# Patient Record
Sex: Male | Born: 1950 | Race: White | Hispanic: No | Marital: Married | Smoking: Never smoker
Health system: Southern US, Community
[De-identification: ages and names within clinical notes are randomized; demographics above are authoritative.]

## PROBLEM LIST (undated history)

## (undated) DIAGNOSIS — I10 Essential (primary) hypertension: Secondary | ICD-10-CM

## (undated) DIAGNOSIS — E119 Type 2 diabetes mellitus without complications: Secondary | ICD-10-CM

## (undated) DIAGNOSIS — E7800 Pure hypercholesterolemia, unspecified: Secondary | ICD-10-CM

## (undated) DIAGNOSIS — M109 Gout, unspecified: Secondary | ICD-10-CM

## (undated) HISTORY — PX: TOTAL HIP ARTHROPLASTY: SHX124

## (undated) HISTORY — PX: CARDIAC SURGERY: SHX584

---

## 2011-01-12 ENCOUNTER — Encounter (HOSPITAL_COMMUNITY)
Admission: RE | Admit: 2011-01-12 | Discharge: 2011-01-12 | Disposition: A | Discharge status: Home / Self Care | Payer: Worker's Compensation | Source: Ambulatory Visit | Attending: Orthopedic Surgery | Admitting: Orthopedic Surgery

## 2011-01-12 ENCOUNTER — Other Ambulatory Visit (HOSPITAL_COMMUNITY): Payer: Self-pay | Admitting: Orthopedic Surgery

## 2011-01-12 DIAGNOSIS — Z01812 Encounter for preprocedural laboratory examination: Secondary | ICD-10-CM | POA: Insufficient documentation

## 2011-01-12 DIAGNOSIS — Z01818 Encounter for other preprocedural examination: Secondary | ICD-10-CM | POA: Insufficient documentation

## 2011-01-12 DIAGNOSIS — M899 Disorder of bone, unspecified: Secondary | ICD-10-CM

## 2011-01-12 LAB — CBC
Platelets: 283 10*3/uL (ref 150–400)
RDW: 15 % (ref 11.5–15.5)
WBC: 9.1 10*3/uL (ref 4.0–10.5)

## 2011-01-12 LAB — SURGICAL PCR SCREEN: MRSA, PCR: NEGATIVE

## 2011-01-12 LAB — COMPREHENSIVE METABOLIC PANEL
Albumin: 4 g/dL (ref 3.5–5.2)
BUN: 28 mg/dL — ABNORMAL HIGH (ref 6–23)
Calcium: 9.4 mg/dL (ref 8.4–10.5)
Creatinine, Ser: 1.4 mg/dL (ref 0.4–1.5)
Total Protein: 6.9 g/dL (ref 6.0–8.3)

## 2011-01-12 LAB — APTT: aPTT: 33 seconds (ref 24–37)

## 2011-01-12 LAB — PROTIME-INR: INR: 0.92 (ref 0.00–1.49)

## 2011-01-13 LAB — URINALYSIS, ROUTINE W REFLEX MICROSCOPIC
Bilirubin Urine: NEGATIVE
Glucose, UA: NEGATIVE mg/dL
Hgb urine dipstick: NEGATIVE
Ketones, ur: NEGATIVE mg/dL
pH: 5.5 (ref 5.0–8.0)

## 2011-01-13 LAB — URINE MICROSCOPIC-ADD ON

## 2011-01-14 ENCOUNTER — Ambulatory Visit (HOSPITAL_COMMUNITY)
Admission: RE | Admit: 2011-01-14 | Discharge: 2011-01-14 | Disposition: A | Discharge status: Home / Self Care | Payer: Worker's Compensation | Source: Ambulatory Visit | Attending: Orthopedic Surgery | Admitting: Orthopedic Surgery

## 2011-01-14 DIAGNOSIS — I10 Essential (primary) hypertension: Secondary | ICD-10-CM | POA: Insufficient documentation

## 2011-01-14 DIAGNOSIS — M2240 Chondromalacia patellae, unspecified knee: Secondary | ICD-10-CM | POA: Insufficient documentation

## 2011-01-14 DIAGNOSIS — M23329 Other meniscus derangements, posterior horn of medial meniscus, unspecified knee: Secondary | ICD-10-CM | POA: Insufficient documentation

## 2011-01-14 DIAGNOSIS — E119 Type 2 diabetes mellitus without complications: Secondary | ICD-10-CM | POA: Insufficient documentation

## 2011-01-14 DIAGNOSIS — E669 Obesity, unspecified: Secondary | ICD-10-CM | POA: Insufficient documentation

## 2011-01-14 DIAGNOSIS — G4733 Obstructive sleep apnea (adult) (pediatric): Secondary | ICD-10-CM | POA: Insufficient documentation

## 2011-01-14 LAB — GLUCOSE, CAPILLARY: Glucose-Capillary: 126 mg/dL — ABNORMAL HIGH (ref 70–99)

## 2011-02-03 NOTE — Op Note (Signed)
NAMESALIM, FORERO                ACCOUNT NO.:  [id number]  MEDICAL RECORD NO.:  [id number]           PATIENT TYPE:  O  LOCATION:  [facility]                         FACILITY:  [facility]  PHYSICIAN:  Vania Rea. Tighe Gitto, M.D.  DATE OF BIRTH:  Oct 11, 1950  DATE OF PROCEDURE:  01/14/2011 DATE OF DISCHARGE:  01/14/2011                              OPERATIVE REPORT   PREOPERATIVE DIAGNOSIS:  Right medial knee pain with palpable medial meniscal tear and possible osteochondral defect in the medial femoral condyle.  POSTOPERATIVE DIAGNOSES: 1. Right knee posterior horn medial meniscus tear. 2. Chondromalacia of the patella.  PROCEDURES: 1. Right knee diagnostic arthroscopy. 2. Partial medial meniscectomy. 3. Chondroplasty of the patella.  SURGEON:  Vania Rea. Ellen Goris, MD  ASSISTANT:  Lucita Lora. Shuford, PA-C  ANESTHESIA:  LMA general.  ESTIMATED BLOOD LOSS:  Minimal.  DRAINS:  None.  TOURNIQUET:  None.  HISTORY:  Mr. Mcclenton is a 60 year old gentleman who has had persistent right knee pain, swelling, mechanical symptoms refractory to prolonged attempts at conservative management.  He completed today an MRI scan which showed evidence for a tear of the medial meniscus and also evidence for altered signal within the medial femoral condyle suggestive of a possible focal area of osteonecrosis versus osteochondral defect. Due to Mr. Bernasconi' ongoing pain, as well as functional limitations and mechanical symptoms, he is brought to the operating room at this time for planned right knee arthroscopy as described below.  Preoperatively counseled Mr. Tanzi on treatment options as well as risks versus benefits thereof.  Possible surgical complications were reviewed including potential for bleeding, infection, neurovascular injury, DVT, PE as well as persistent pain.  He understands and accepts and agrees with our planned procedure.  PROCEDURE IN DETAIL:  After undergoing routine preop evaluation,  the patient received prophylactic antibiotics and was brought to the operating, placed supine on operating table, underwent smooth induction of an LMA general anesthesia.  Right leg was placed in leg holder and sterilely prepped and draped in standard fashion.  Time-out was called. Standard arthroscopy portals were established and diagnostic arthroscopy was performed.  The suprapatellar pouch and gutter showed no loose bodies.  There was significant anterior chamber synovitis and the synovectomy was performed and then the Arthrex wand was used to obtain hemostasis.  The patella showed broad grade 3 chondromalacia diffusely on the central and lateral recess which was debrided to a stable chondral base with a shaver.  Intercondylar notch of the ACL to be intact.  Negative intraoperative Lachman.  Medially, I carefully inspected and probed the articular surface of the medial femoral condyle and did not see any evidence to suggest an type of flap lesion or unstable OCD.  The surfaces were actually in quite good condition on the medial femoral condyle.  There were some minimal fibrillations of the medial tibial plateau.  There was a radial tear of the posterior horn of medial meniscus and this was trimmed to a stable margin with a basket and then a shaver was used to final contouring and removal of the meniscal fragments.  Probing of the remaining portion of the medial  meniscus showed to be stable and an estimated approximately 20% of the meniscus was excised.  Laterally, the articular surfaces were in good condition and the lateral meniscus was intact and stable to probing.  At this point, final inspection and irrigation of the joint was then completed.  Fluid and instrument removed.  Combination of Marcaine, morphine, and clonidine was instilled in the knee joint and additional Marcaine about the ports.  Ports were closed with Steri-Strips.  Bulky dry dressings were wrapped about the right  knee and leg was wrapped with Ace bandage and a thigh-high support stocking.  The patient was then awakened, extubated, and taken to recovery room in stable condition.     Vania Rea. Royalty Domagala, M.D.     KMS/MEDQ  D:  01/14/2011  T:  01/14/2011  Job:  [id number]  Electronically Signed by Francena Hanly M.D. on 02/03/2011 02:31:21 PM

## 2012-04-18 IMAGING — CR DG CHEST 2V
2 series · 2 of 2 positions shown · non-contrast
Comparison: None.

CLINICAL DATA: Further evaluation for right knee arthroscopically.
Hypertension, diabetes and sleep apnea

CHEST - 2 VIEW

[view not recorded (1 of 2)]
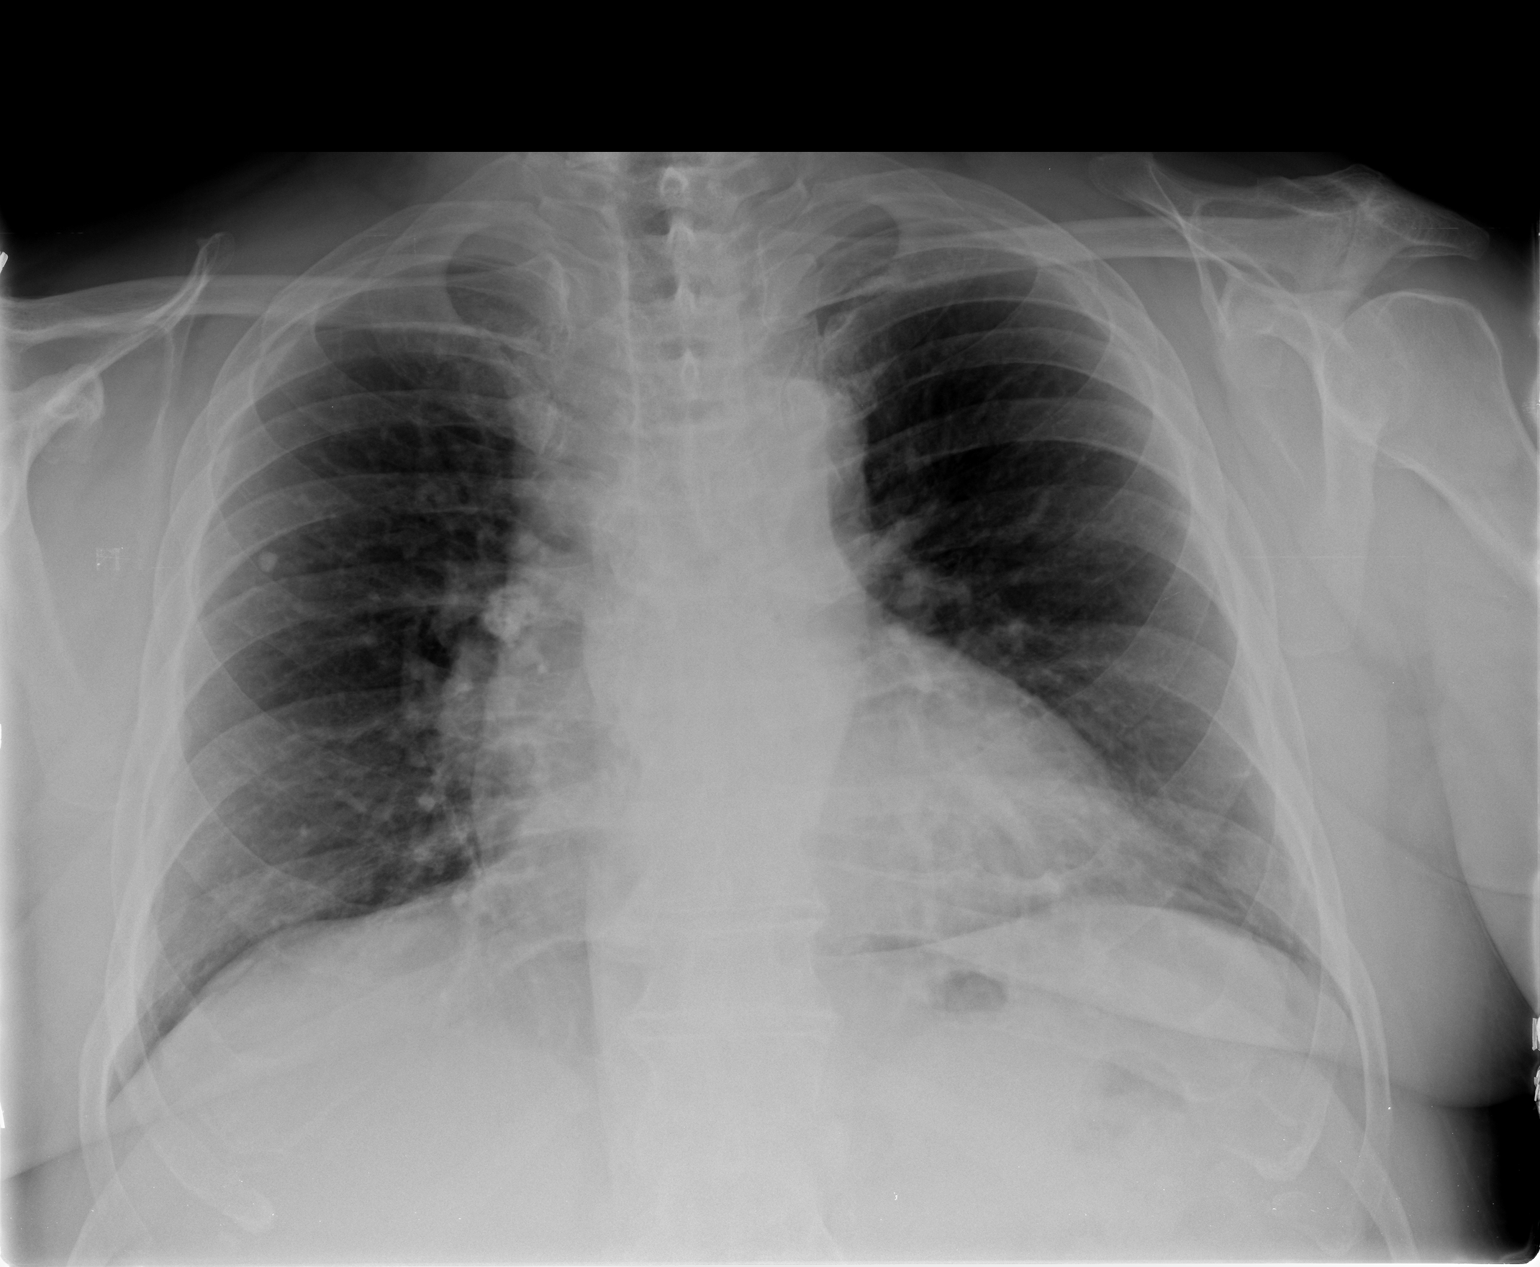

[view not recorded (2 of 2)]
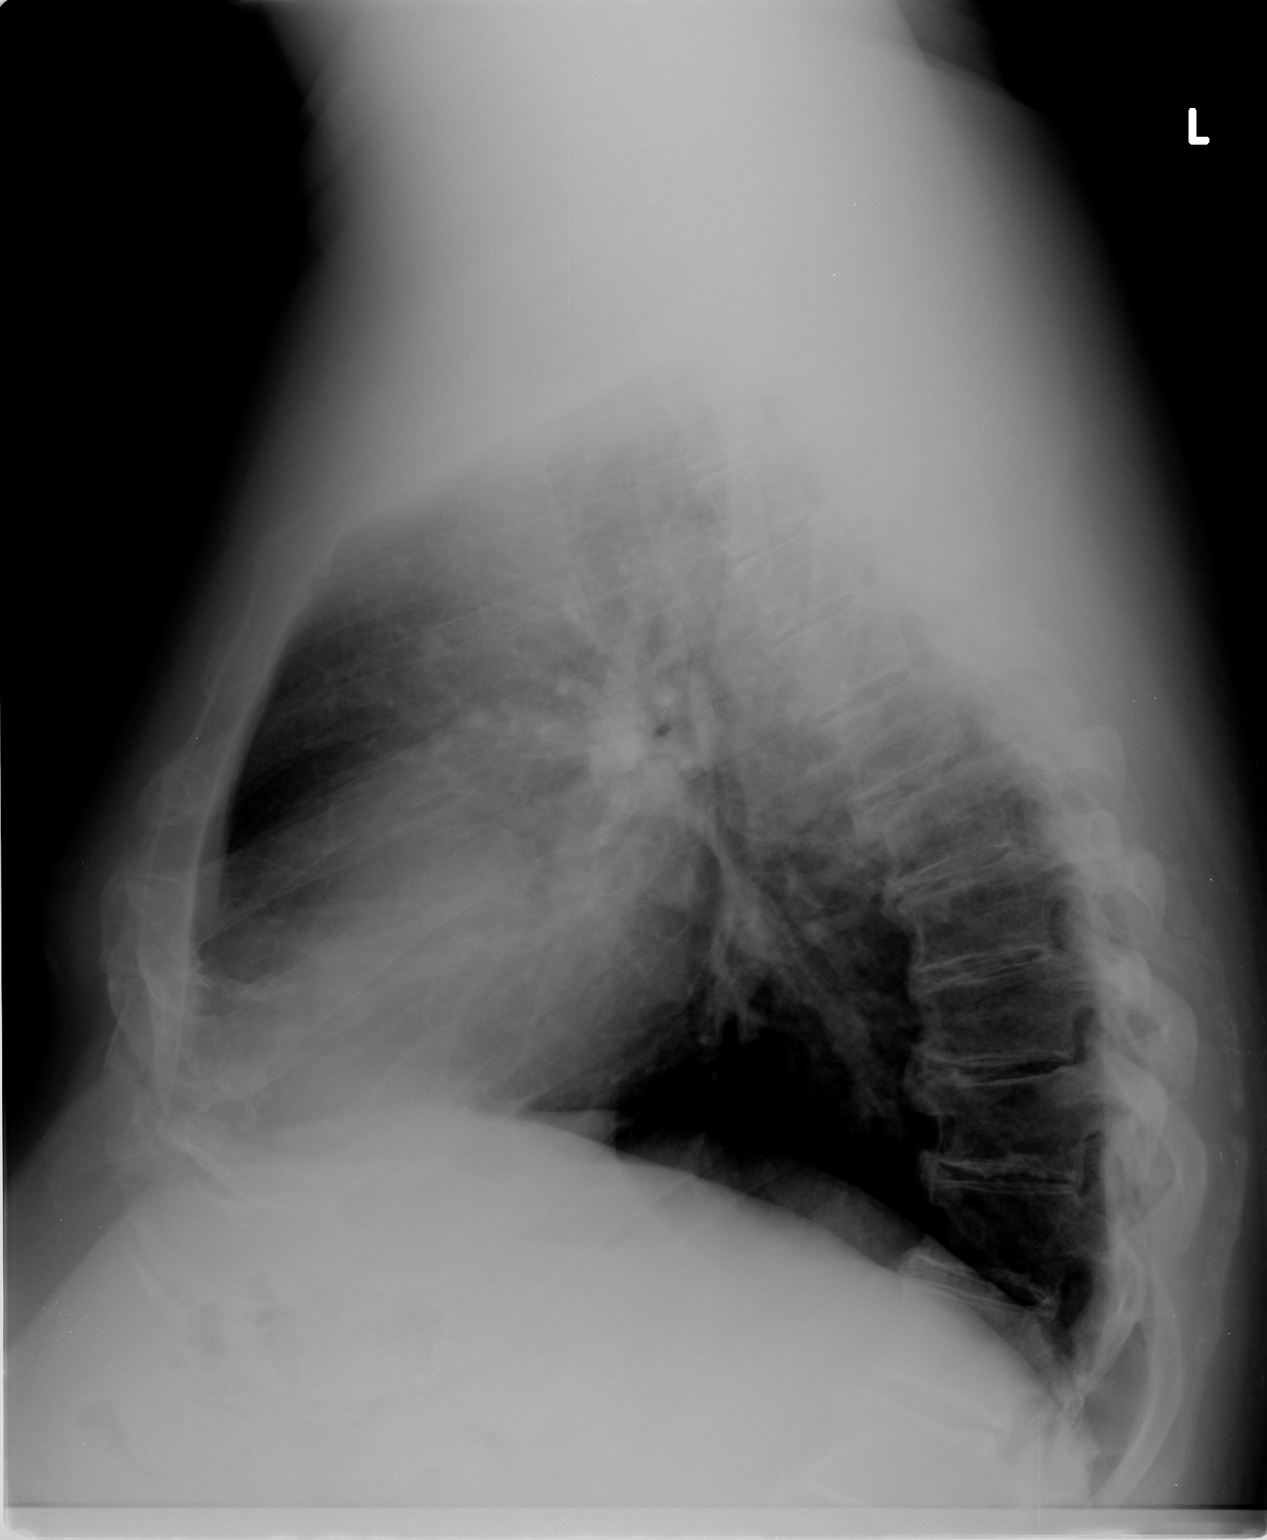

[2 of 2 positions shown; findings below may reference images not displayed]

FINDINGS: Low lung volumes are present with resultant crowding of
bronchovascular markings at both lung bases.

Heart size is at the upper limits of normal.  Calcified hilar lymph
nodes are seen compatible with prior granulomatous disease.  The
mediastinal contours otherwise unremarkable.

The lung fields are clear with the exception of a calcified
granuloma in the right upper lobe.  No focal infiltrates or signs
of congestive failure seen.  No pleural fluid or significant
peribronchial cuffing is identified.

Bony structures demonstrate degenerative osteophytosis of the
thoracic spine and are otherwise clear.
IMPRESSION: Findings compatible with prior granulomatous disease. Low lung
volumes with no acute or worrisome focal abnormality seen

## 2015-01-09 ENCOUNTER — Encounter (HOSPITAL_BASED_OUTPATIENT_CLINIC_OR_DEPARTMENT_OTHER): Payer: Self-pay | Admitting: *Deleted

## 2015-01-09 ENCOUNTER — Emergency Department (HOSPITAL_BASED_OUTPATIENT_CLINIC_OR_DEPARTMENT_OTHER): Payer: 59

## 2015-01-09 ENCOUNTER — Emergency Department (HOSPITAL_BASED_OUTPATIENT_CLINIC_OR_DEPARTMENT_OTHER)
Admission: EM | Admit: 2015-01-09 | Discharge: 2015-01-10 | Disposition: A | Discharge status: Home / Self Care | Payer: 59 | Attending: Emergency Medicine | Admitting: Emergency Medicine

## 2015-01-09 DIAGNOSIS — E119 Type 2 diabetes mellitus without complications: Secondary | ICD-10-CM | POA: Insufficient documentation

## 2015-01-09 DIAGNOSIS — J209 Acute bronchitis, unspecified: Secondary | ICD-10-CM | POA: Insufficient documentation

## 2015-01-09 DIAGNOSIS — M109 Gout, unspecified: Secondary | ICD-10-CM | POA: Diagnosis not present

## 2015-01-09 DIAGNOSIS — Z7982 Long term (current) use of aspirin: Secondary | ICD-10-CM | POA: Diagnosis not present

## 2015-01-09 DIAGNOSIS — E780 Pure hypercholesterolemia: Secondary | ICD-10-CM | POA: Diagnosis not present

## 2015-01-09 DIAGNOSIS — I10 Essential (primary) hypertension: Secondary | ICD-10-CM | POA: Diagnosis not present

## 2015-01-09 DIAGNOSIS — Z79899 Other long term (current) drug therapy: Secondary | ICD-10-CM | POA: Insufficient documentation

## 2015-01-09 DIAGNOSIS — Z880 Allergy status to penicillin: Secondary | ICD-10-CM | POA: Diagnosis not present

## 2015-01-09 DIAGNOSIS — R05 Cough: Secondary | ICD-10-CM | POA: Diagnosis present

## 2015-01-09 HISTORY — DX: Essential (primary) hypertension: I10

## 2015-01-09 HISTORY — DX: Pure hypercholesterolemia, unspecified: E78.00

## 2015-01-09 HISTORY — DX: Type 2 diabetes mellitus without complications: E11.9

## 2015-01-09 HISTORY — DX: Gout, unspecified: M10.9

## 2015-01-09 MED ORDER — ALBUTEROL SULFATE (2.5 MG/3ML) 0.083% IN NEBU
2.5000 mg | INHALATION_SOLUTION | Freq: Once | RESPIRATORY_TRACT | Status: AC
  Administered 2015-01-09: 2.5 mg via RESPIRATORY_TRACT
  Filled 2015-01-09: qty 3

## 2015-01-09 MED ORDER — IPRATROPIUM-ALBUTEROL 0.5-2.5 (3) MG/3ML IN SOLN
3.0000 mL | Freq: Once | RESPIRATORY_TRACT | Status: AC
  Administered 2015-01-09: 3 mL via RESPIRATORY_TRACT
  Filled 2015-01-09: qty 3

## 2015-01-09 MED ORDER — ACETAMINOPHEN 500 MG PO TABS
1000.0000 mg | ORAL_TABLET | Freq: Once | ORAL | Status: AC
  Administered 2015-01-09: 1000 mg via ORAL
  Filled 2015-01-09: qty 2

## 2015-01-09 NOTE — ED Notes (Signed)
Cough for couple of days. He had a nebulizer with some relief.

## 2015-01-10 LAB — CBC WITH DIFFERENTIAL/PLATELET
BASOS ABS: 0 10*3/uL (ref 0.0–0.1)
BASOS PCT: 0 % (ref 0–1)
Eosinophils Absolute: 0 10*3/uL (ref 0.0–0.7)
Eosinophils Relative: 0 % (ref 0–5)
HCT: 34.8 % — ABNORMAL LOW (ref 39.0–52.0)
Hemoglobin: 11.5 g/dL — ABNORMAL LOW (ref 13.0–17.0)
Lymphocytes Relative: 14 % (ref 12–46)
Lymphs Abs: 1.4 10*3/uL (ref 0.7–4.0)
MCH: 29.4 pg (ref 26.0–34.0)
MCHC: 33 g/dL (ref 30.0–36.0)
MCV: 89 fL (ref 78.0–100.0)
Monocytes Absolute: 1.6 10*3/uL — ABNORMAL HIGH (ref 0.1–1.0)
Monocytes Relative: 16 % — ABNORMAL HIGH (ref 3–12)
NEUTROS ABS: 7 10*3/uL (ref 1.7–7.7)
NEUTROS PCT: 70 % (ref 43–77)
PLATELETS: 181 10*3/uL (ref 150–400)
RBC: 3.91 MIL/uL — ABNORMAL LOW (ref 4.22–5.81)
RDW: 15.9 % — AB (ref 11.5–15.5)
WBC: 10 10*3/uL (ref 4.0–10.5)

## 2015-01-10 LAB — BASIC METABOLIC PANEL
Anion gap: 8 (ref 5–15)
BUN: 41 mg/dL — ABNORMAL HIGH (ref 6–23)
CHLORIDE: 103 mmol/L (ref 96–112)
CO2: 25 mmol/L (ref 19–32)
Calcium: 7.8 mg/dL — ABNORMAL LOW (ref 8.4–10.5)
Creatinine, Ser: 2 mg/dL — ABNORMAL HIGH (ref 0.50–1.35)
GFR, EST AFRICAN AMERICAN: 39 mL/min — AB (ref >90)
GFR, EST NON AFRICAN AMERICAN: 34 mL/min — AB (ref >90)
Glucose, Bld: 192 mg/dL — ABNORMAL HIGH (ref 70–99)
POTASSIUM: 3.6 mmol/L (ref 3.5–5.1)
SODIUM: 136 mmol/L (ref 135–145)

## 2015-01-10 LAB — RAPID STREP SCREEN (MED CTR MEBANE ONLY): STREPTOCOCCUS, GROUP A SCREEN (DIRECT): NEGATIVE

## 2015-01-10 LAB — I-STAT CG4 LACTIC ACID, ED: Lactic Acid, Venous: 0.94 mmol/L (ref 0.5–2.0)

## 2015-01-10 MED ORDER — ALBUTEROL SULFATE HFA 108 (90 BASE) MCG/ACT IN AERS
2.0000 | INHALATION_SPRAY | RESPIRATORY_TRACT | Status: DC | PRN
  Administered 2015-01-10: 2 via RESPIRATORY_TRACT
  Filled 2015-01-10: qty 6.7

## 2015-01-10 MED ORDER — LEVOFLOXACIN 750 MG PO TABS: ORAL_TABLET | ORAL | Status: DC

## 2015-01-10 MED ORDER — RACEPINEPHRINE HCL 2.25 % IN NEBU
0.5000 mL | INHALATION_SOLUTION | Freq: Once | RESPIRATORY_TRACT | Status: AC
  Administered 2015-01-10: 0.5 mL via RESPIRATORY_TRACT
  Filled 2015-01-10: qty 0.5

## 2015-01-10 MED ORDER — LEVOFLOXACIN 750 MG PO TABS
750.0000 mg | ORAL_TABLET | Freq: Once | ORAL | Status: AC
  Administered 2015-01-10: 750 mg via ORAL
  Filled 2015-01-10: qty 1

## 2015-01-10 MED ORDER — ALBUTEROL SULFATE (2.5 MG/3ML) 0.083% IN NEBU
5.0000 mg | INHALATION_SOLUTION | Freq: Once | RESPIRATORY_TRACT | Status: AC
  Administered 2015-01-10: 5 mg via RESPIRATORY_TRACT
  Filled 2015-01-10: qty 6

## 2015-01-10 NOTE — Discharge Instructions (Signed)

## 2015-01-10 NOTE — Patient Instructions (Signed)
Instructed patient on the proper use of administering albuterol mdi via aerochamber patient tolerated well 

## 2015-01-10 NOTE — ED Provider Notes (Signed)
CSN: [id number]     Arrival date & time 01/09/15  2219 History   First MD Initiated Contact with Patient 01/10/15 0002     Chief Complaint  Patient presents with  . Cough     (Consider location/radiation/quality/duration/timing/severity/associated sxs/prior Treatment) HPI  This is a 64 year old male with a 2 to three-day history of sore throat, cough, wheezing and fever. Fever is been as high as 102.7. He was given Tylenol on arrival to treat this. He does not have a history of COPD, asthma or chronic lung disease. He was given an albuterol and Atrovent neb treatment on arrival per protocol with partial improvement in his breathing. He denies nausea, vomiting or diarrhea. He denies chest pain except some soreness with cough.  Past Medical History  Diagnosis Date  . Diabetes mellitus without complication   . Hypertension   . Gout   . High cholesterol    Past Surgical History  Procedure Laterality Date  . Total hip arthroplasty     No family history on file. History  Substance Use Topics  . Smoking status: Never Smoker   . Smokeless tobacco: Not on file  . Alcohol Use: No    Review of Systems  All other systems reviewed and are negative.   Allergies  Penicillins  Home Medications   Prior to Admission medications   Medication Sig Start Date End Date Taking? Authorizing Provider  ALLOPURINOL PO Take by mouth.   Yes Historical Provider, MD  AMLODIPINE BESYLATE PO Take by mouth.   Yes Historical Provider, MD  aspirin 81 MG tablet Take 81 mg by mouth daily.   Yes Historical Provider, MD  ATENOLOL PO Take by mouth.   Yes Historical Provider, MD  doxazosin (CARDURA) 2 MG tablet Take 2 mg by mouth daily.   Yes Historical Provider, MD  fosinopril (MONOPRIL) 40 MG tablet Take 40 mg by mouth daily.   Yes Historical Provider, MD  furosemide (LASIX) 20 MG tablet Take 20 mg by mouth.   Yes Historical Provider, MD  glipiZIDE (GLUCOTROL XL) 5 MG 24 hr tablet Take 5 mg by mouth daily  with breakfast.   Yes Historical Provider, MD  Pioglitazone HCl (ACTOS PO) Take by mouth.   Yes Historical Provider, MD  SIMVASTATIN PO Take by mouth.   Yes Historical Provider, MD   BP 119/53 mmHg  Pulse 83  Temp(Src) 102.5 F (39.2 C) (Oral)  Resp 18  Ht 5' 4.5" (1.638 m)  Wt 280 lb (127.007 kg)  BMI 47.34 kg/m2  SpO2 93%   Physical Exam  General: Well-developed, well-nourished male in no acute distress; appearance consistent with age of record HENT: normocephalic; atraumatic; no pharyngeal erythema, edema or exudate Eyes: pupils equal, round and reactive to light; extraocular muscles intact Neck: supple Heart: regular rate and rhythm Lungs: Expiratory wheezing laterally; stridor Abdomen: soft; nondistended; nontender; bowel sounds present Extremities: No deformity; full range of motion; pulses normal; +1 edema of lower legs Neurologic: Awake, alert and oriented; motor function intact in all extremities and symmetric; no facial droop Skin: Warm and dry Psychiatric: Normal mood and affect    ED Course  Procedures (including critical care time)   MDM  Nursing notes and vitals signs, including pulse oximetry, reviewed.  Summary of this visit's results, reviewed by myself:  Labs:  Results for orders placed or performed during the hospital encounter of 01/09/15 (from the past 24 hour(s))  CBC with Differential/Platelet     Status: Abnormal   Collection Time: 01/10/15  12:10 AM  Result Value Ref Range   WBC 10.0 4.0 - 10.5 K/uL   RBC 3.91 (L) 4.22 - 5.81 MIL/uL   Hemoglobin 11.5 (L) 13.0 - 17.0 g/dL   HCT 21.334.8 (L) 08.639.0 - 57.852.0 %   MCV 89.0 78.0 - 100.0 fL   MCH 29.4 26.0 - 34.0 pg   MCHC 33.0 30.0 - 36.0 g/dL   RDW 46.915.9 (H) 62.911.5 - 52.815.5 %   Platelets 181 150 - 400 K/uL   Neutrophils Relative % 70 43 - 77 %   Neutro Abs 7.0 1.7 - 7.7 K/uL   Lymphocytes Relative 14 12 - 46 %   Lymphs Abs 1.4 0.7 - 4.0 K/uL   Monocytes Relative 16 (H) 3 - 12 %   Monocytes Absolute 1.6  (H) 0.1 - 1.0 K/uL   Eosinophils Relative 0 0 - 5 %   Eosinophils Absolute 0.0 0.0 - 0.7 K/uL   Basophils Relative 0 0 - 1 %   Basophils Absolute 0.0 0.0 - 0.1 K/uL  Basic metabolic panel     Status: Abnormal   Collection Time: 01/10/15 12:10 AM  Result Value Ref Range   Sodium 136 135 - 145 mmol/L   Potassium 3.6 3.5 - 5.1 mmol/L   Chloride 103 96 - 112 mmol/L   CO2 25 19 - 32 mmol/L   Glucose, Bld 192 (H) 70 - 99 mg/dL   BUN 41 (H) 6 - 23 mg/dL   Creatinine, Ser 4.132.00 (H) 0.50 - 1.35 mg/dL   Calcium 7.8 (L) 8.4 - 10.5 mg/dL   GFR calc non Af Amer 34 (L) >90 mL/min   GFR calc Af Amer 39 (L) >90 mL/min   Anion gap 8 5 - 15  I-Stat CG4 Lactic Acid, ED     Status: None   Collection Time: 01/10/15 12:21 AM  Result Value Ref Range   Lactic Acid, Venous 0.94 0.5 - 2.0 mmol/L  Rapid strep screen     Status: None   Collection Time: 01/10/15 12:24 AM  Result Value Ref Range   Streptococcus, Group A Screen (Direct) NEGATIVE NEGATIVE    Imaging Studies: Dg Chest 2 View  01/09/2015   CLINICAL DATA:  Cough, congestion, fever for 2 days.  EXAM: CHEST  2 VIEW  COMPARISON:  01/12/2011  FINDINGS: Heart is normal size. Mild peribronchial thickening. Calcified granuloma in the right upper lobe with calcified right hilar lymph nodes, stable. No confluent airspace opacity or effusion. Degenerative changes in the thoracic spine.  IMPRESSION: Mild bronchitic changes.  Old granulomatous disease.   Electronically Signed   By: Charlett NoseKevin  Dover M.D.   On: 01/09/2015 23:25   2:32 AM Air movement improved and wheezing and stridor improved after additional albuterol neb treatment and racemic epi treatment. We'll avoid steroids as patient is diabetic. We will provide a home and inhaler. We will treat with a course of Levaquin for 5 days as the patient has risk factors putting him at risk for a secondary bacterial pneumonia in the setting of what is likely an acute viral illness.      Paula LibraJohn Zailyn Rowser, MD 01/10/15  [phone]

## 2015-01-12 LAB — CULTURE, GROUP A STREP: Strep A Culture: NEGATIVE

## 2015-01-16 LAB — CULTURE, BLOOD (ROUTINE X 2)
CULTURE: NO GROWTH
Culture: NO GROWTH

## 2016-10-04 ENCOUNTER — Emergency Department (HOSPITAL_BASED_OUTPATIENT_CLINIC_OR_DEPARTMENT_OTHER)
Admission: EM | Admit: 2016-10-04 | Discharge: 2016-10-04 | Disposition: A | Discharge status: Home / Self Care | Payer: 59 | Attending: Emergency Medicine | Admitting: Emergency Medicine

## 2016-10-04 ENCOUNTER — Emergency Department (HOSPITAL_BASED_OUTPATIENT_CLINIC_OR_DEPARTMENT_OTHER): Payer: 59

## 2016-10-04 ENCOUNTER — Encounter (HOSPITAL_BASED_OUTPATIENT_CLINIC_OR_DEPARTMENT_OTHER): Payer: Self-pay | Admitting: Emergency Medicine

## 2016-10-04 DIAGNOSIS — W01190A Fall on same level from slipping, tripping and stumbling with subsequent striking against furniture, initial encounter: Secondary | ICD-10-CM | POA: Insufficient documentation

## 2016-10-04 DIAGNOSIS — I10 Essential (primary) hypertension: Secondary | ICD-10-CM | POA: Insufficient documentation

## 2016-10-04 DIAGNOSIS — E119 Type 2 diabetes mellitus without complications: Secondary | ICD-10-CM | POA: Insufficient documentation

## 2016-10-04 DIAGNOSIS — Y999 Unspecified external cause status: Secondary | ICD-10-CM | POA: Diagnosis not present

## 2016-10-04 DIAGNOSIS — W19XXXA Unspecified fall, initial encounter: Secondary | ICD-10-CM

## 2016-10-04 DIAGNOSIS — Z79899 Other long term (current) drug therapy: Secondary | ICD-10-CM | POA: Insufficient documentation

## 2016-10-04 DIAGNOSIS — Z7984 Long term (current) use of oral hypoglycemic drugs: Secondary | ICD-10-CM | POA: Insufficient documentation

## 2016-10-04 DIAGNOSIS — S3992XA Unspecified injury of lower back, initial encounter: Secondary | ICD-10-CM | POA: Diagnosis present

## 2016-10-04 DIAGNOSIS — Y929 Unspecified place or not applicable: Secondary | ICD-10-CM | POA: Diagnosis not present

## 2016-10-04 DIAGNOSIS — S336XXA Sprain of sacroiliac joint, initial encounter: Secondary | ICD-10-CM | POA: Insufficient documentation

## 2016-10-04 DIAGNOSIS — Y9389 Activity, other specified: Secondary | ICD-10-CM | POA: Diagnosis not present

## 2016-10-04 MED ORDER — OXYCODONE-ACETAMINOPHEN 5-325 MG PO TABS: 1.0000 | ORAL_TABLET | Freq: Four times a day (QID) | ORAL | 0 refills | Status: AC | PRN

## 2016-10-04 MED ORDER — OXYCODONE-ACETAMINOPHEN 5-325 MG PO TABS
1.0000 | ORAL_TABLET | Freq: Once | ORAL | Status: AC
  Administered 2016-10-04: 1 via ORAL
  Filled 2016-10-04: qty 1

## 2016-10-04 NOTE — ED Notes (Signed)
ED Provider at bedside. 

## 2016-10-04 NOTE — ED Notes (Signed)
ED Provider at bedside to discuss xray results. 

## 2016-10-04 NOTE — ED Notes (Signed)
Patient transported to X-ray 

## 2016-10-04 NOTE — ED Notes (Addendum)
Xray came to room to transport pt, but he requested to wait until pain med took effect.

## 2016-10-04 NOTE — ED Notes (Signed)
Pt given d/c instructions as per chart. Rx x 1 with precautions. Verbalizes understanding. No questions. 

## 2016-10-04 NOTE — ED Notes (Signed)
Pt states he was installing a shower stall earlier this p.m. And fell. Hit at knee area first, then buttocks, then side. C/O pain to lower left side pain. Describes as spasms. Tried lying down, but pain was too intense. Denies numbness or tingling to extremities. Neuro WNL.

## 2016-10-04 NOTE — ED Triage Notes (Signed)
Patient was replacing a shower head and fell backwards hitting his back. The patient reports that he is having lower back pain

## 2016-10-04 NOTE — ED Provider Notes (Signed)
[facility] Provider Note: Lowella DellJ. Lane Skyllar Notarianni, MD, FACEP  CSN: [id number] MRN: [id number] ARRIVAL: 10/04/16 at 0042 ROOM: [facility]  By signing my name below, I, Clovis PuAvnee Patel, attest that this documentation has been prepared under the direction and in the presence of Paula LibraJohn Nasim Garofano, MD  Electronically Signed: Clovis PuAvnee Patel, ED Scribe. 10/04/16. 1:12 AM.  CHIEF COMPLAINT  Fall   HISTORY OF PRESENT ILLNESS   Wayne Miles is a 66 y.o. male, with a hx of HTN, DM, gout, high cholesterol and a PSHx of cardiac surgery, who presents to the Emergency Department complaining of "7/10" pain at the left SI joint s/p a fall which occurred at 8 PM yesterday evening. Pt states he was replacing the a shower head when he fell backwards causing him to hit his lower back. He tried lying in bed which provided no relief. Pt denies bowel//ballder incontinence, groin numbness, numbness or weakness of his lower extremities, neck pain, abdominal pain and upper back pain. Pain is worse with movement.  [place] [place] [facility] [place] reveals the patient has received only 2 prescriptions for tramadol in the last year..   Past Medical History:  Diagnosis Date  . Diabetes mellitus without complication (HCC)   . Gout   . High cholesterol   . Hypertension     Past Surgical History:  Procedure Laterality Date  . CARDIAC SURGERY    . TOTAL HIP ARTHROPLASTY      History reviewed. No pertinent family history.  Social History  Substance Use Topics  . Smoking status: Never Smoker  . Smokeless tobacco: Never Used  . Alcohol use No    Prior to Admission medications   Medication Sig Start Date End Date Taking? Authorizing Provider  labetalol (NORMODYNE) 300 MG tablet Take 300 mg by mouth 2 (two) times daily.   Yes Historical Provider, MD  losartan (COZAAR) 50 MG tablet Take 50 mg by mouth daily.   Yes Historical Provider, MD  pentoxifylline (TRENTAL) 400 MG CR  tablet Take 400 mg by mouth 3 (three) times daily with meals.   Yes Historical Provider, MD  ALLOPURINOL PO Take by mouth.    Historical Provider, MD  AMLODIPINE BESYLATE PO Take by mouth.    Historical Provider, MD  aspirin 81 MG tablet Take 81 mg by mouth daily.    Historical Provider, MD  ATENOLOL PO Take by mouth.    Historical Provider, MD  doxazosin (CARDURA) 2 MG tablet Take 2 mg by mouth daily.    Historical Provider, MD  fosinopril (MONOPRIL) 40 MG tablet Take 40 mg by mouth daily.    Historical Provider, MD  furosemide (LASIX) 20 MG tablet Take 20 mg by mouth.    Historical Provider, MD  glipiZIDE (GLUCOTROL XL) 5 MG 24 hr tablet Take 5 mg by mouth daily with breakfast.    Historical Provider, MD  levofloxacin (LEVAQUIN) 750 MG tablet Take 1 tablet daily starting Saturday, 01/11/2015. 01/10/15   Paula LibraJohn Nekesha Font, MD  Pioglitazone HCl (ACTOS PO) Take by mouth.    Historical Provider, MD  SIMVASTATIN PO Take by mouth.    Historical Provider, MD    Allergies Penicillins   REVIEW OF SYSTEMS  Negative except as noted here or in the History of Present Illness.   PHYSICAL EXAMINATION  Initial Vital Signs There were no vitals taken for this visit.  Examination General: Well-developed, well-nourished male in no acute distress; appearance consistent with age of record HENT: normocephalic; atraumatic Eyes:  pupils equal, round and reactive to light; extraocular muscles intact Neck: supple Heart: regular rate and rhythm Lungs: clear to auscultation bilaterally Chest: well healing but tender midline sternotomy incision.  Abdomen: soft; nondistended; nontender; bowel sounds present Back: mild lower lumbar midline tenderness with significant left SI tenderness.  Extremities: No deformity; full range of motion; pulses normal; trace edema of lower legs.  Neurologic: Awake, alert and oriented; motor function intact in all extremities and symmetric; no facial droop Skin: Warm and  dry Psychiatric: Normal mood and affect   RESULTS  Summary of this visit's results, reviewed by myself:   EKG Interpretation  Date/Time:    Ventricular Rate:    PR Interval:    QRS Duration:   QT Interval:    QTC Calculation:   R Axis:     Text Interpretation:        Laboratory Studies: No results found for this or any previous visit (from the past 24 hour(s)). Imaging Studies: Dg Lumbar Spine Complete  Result Date: 10/04/2016 CLINICAL DATA:  Slipped and fell, midline low back pain EXAM: LUMBAR SPINE - COMPLETE 4+ VIEW COMPARISON:  CT 11/05/2013 FINDINGS: Partially visualized sternotomy changes. Minimal scoliosis. Lumbar alignment otherwise within normal limits. Vertebral body heights grossly maintained. Moderate multilevel degenerative changes with osteophytes. Atherosclerosis. IMPRESSION: Degenerative changes.  No acute osseous abnormality Electronically Signed   By: Jasmine Pang M.D.   On: 10/04/2016 02:34    ED COURSE  Nursing notes and initial vitals signs, including pulse oximetry, reviewed.  Vitals:   10/04/16 0100 10/04/16 0209  BP: 169/88 175/79  Pulse: 72 74  Resp: 20 20  Temp: 98.1 F (36.7 C) 97.7 F (36.5 C)  TempSrc: Oral Oral  SpO2: 100% 99%  Weight: 278 lb (126.1 kg)   Height: 5\' 4"  (1.626 m)     PROCEDURES    ED DIAGNOSES     ICD-9-CM ICD-10-CM   1. Sacroiliac (ligament) sprain, initial encounter 846.1 S33.6XXA   2. Fall, initial encounter [phone] [email or web address] Skeens     I personally performed the services described in this documentation, which was scribed in my presence. The recorded information has been reviewed and is accurate.     Paula Libra, MD 10/04/16 [phone]

## 2016-10-04 NOTE — ED Notes (Signed)
Returned from xray

## 2018-01-09 IMAGING — CR DG LUMBAR SPINE COMPLETE 4+V
5 series · 5 of 5 positions shown · non-contrast
Comparison: CT 11/05/2013

CLINICAL DATA: Slipped and fell, midline low back pain

EXAM:
LUMBAR SPINE - COMPLETE 4+ VIEW

[w l-spine a.p. *]
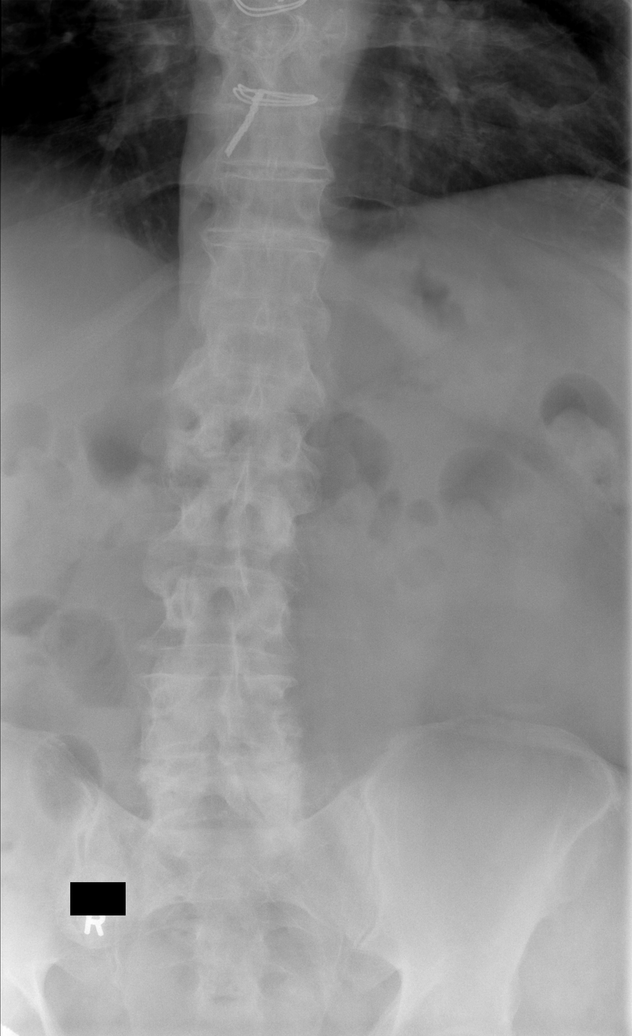

[w x table l-spine * (1 of 3)]
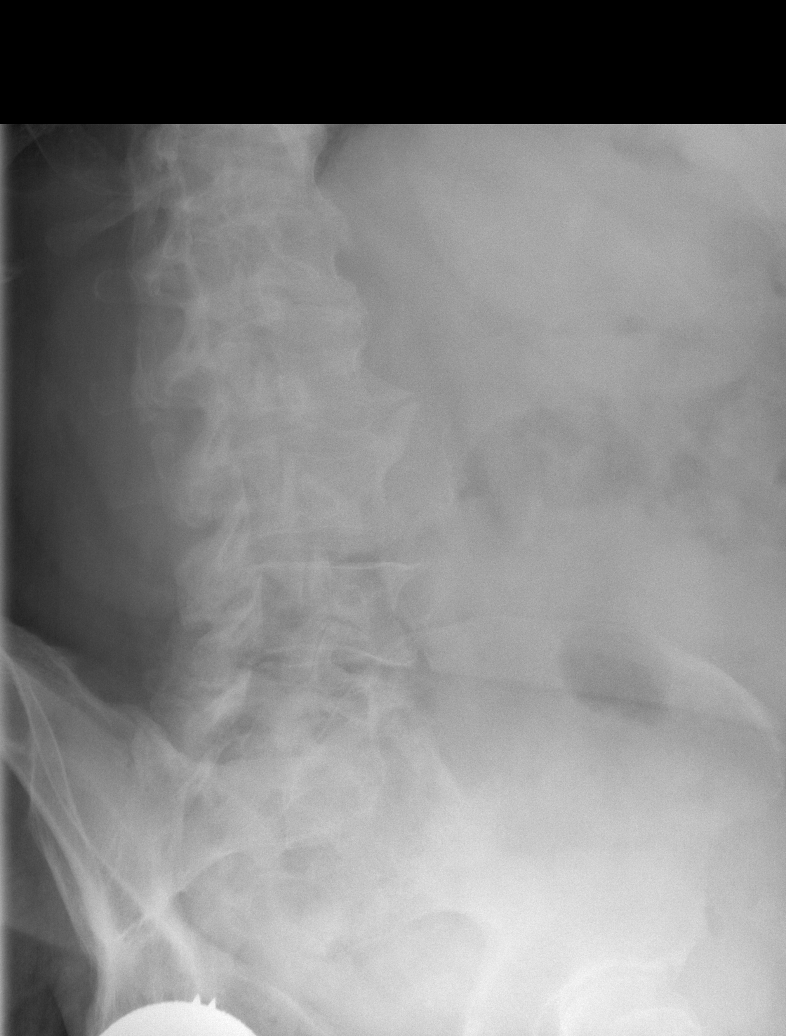

[w x table l-spine]
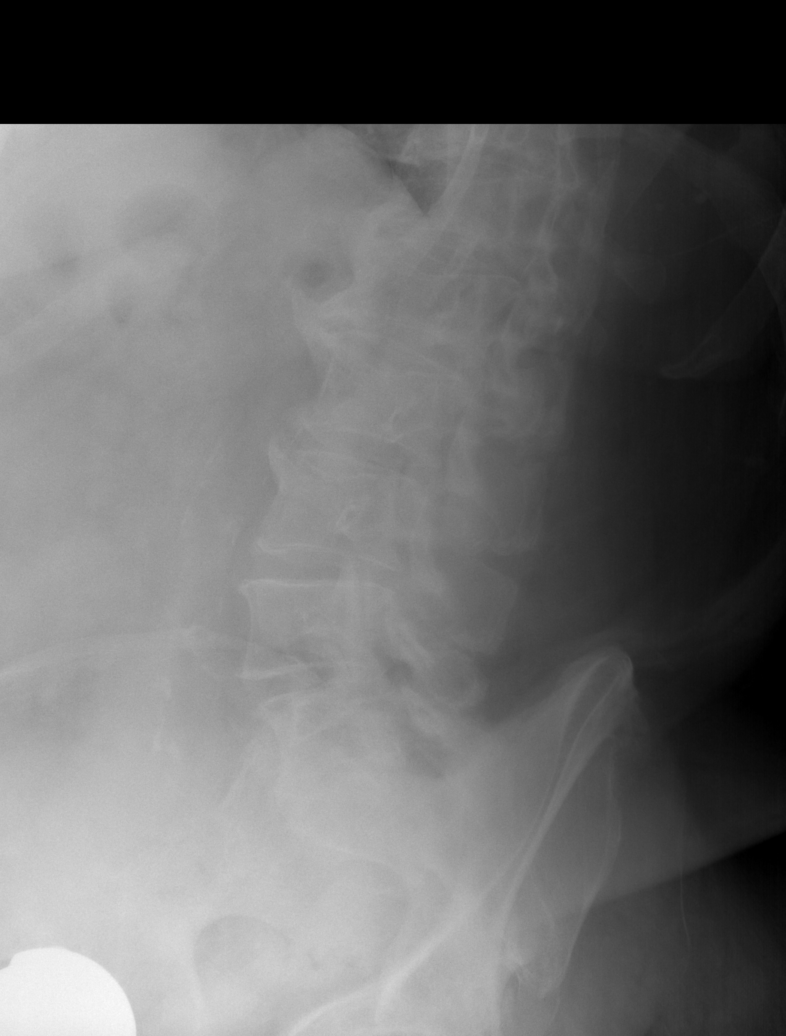

[w x table l-spine * (2 of 3)]
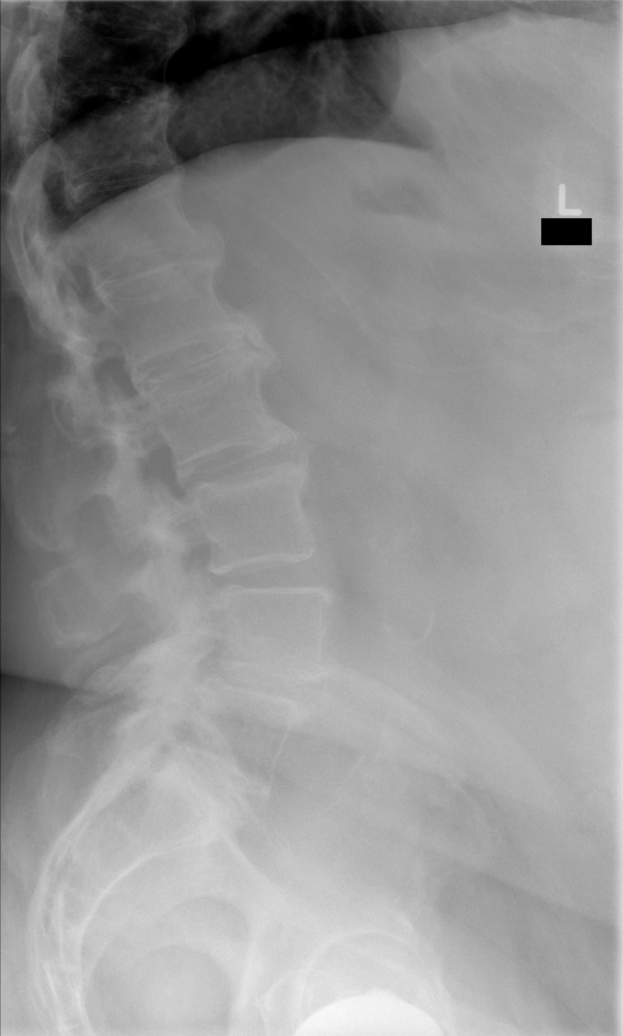

[w x table l-spine * (3 of 3)]
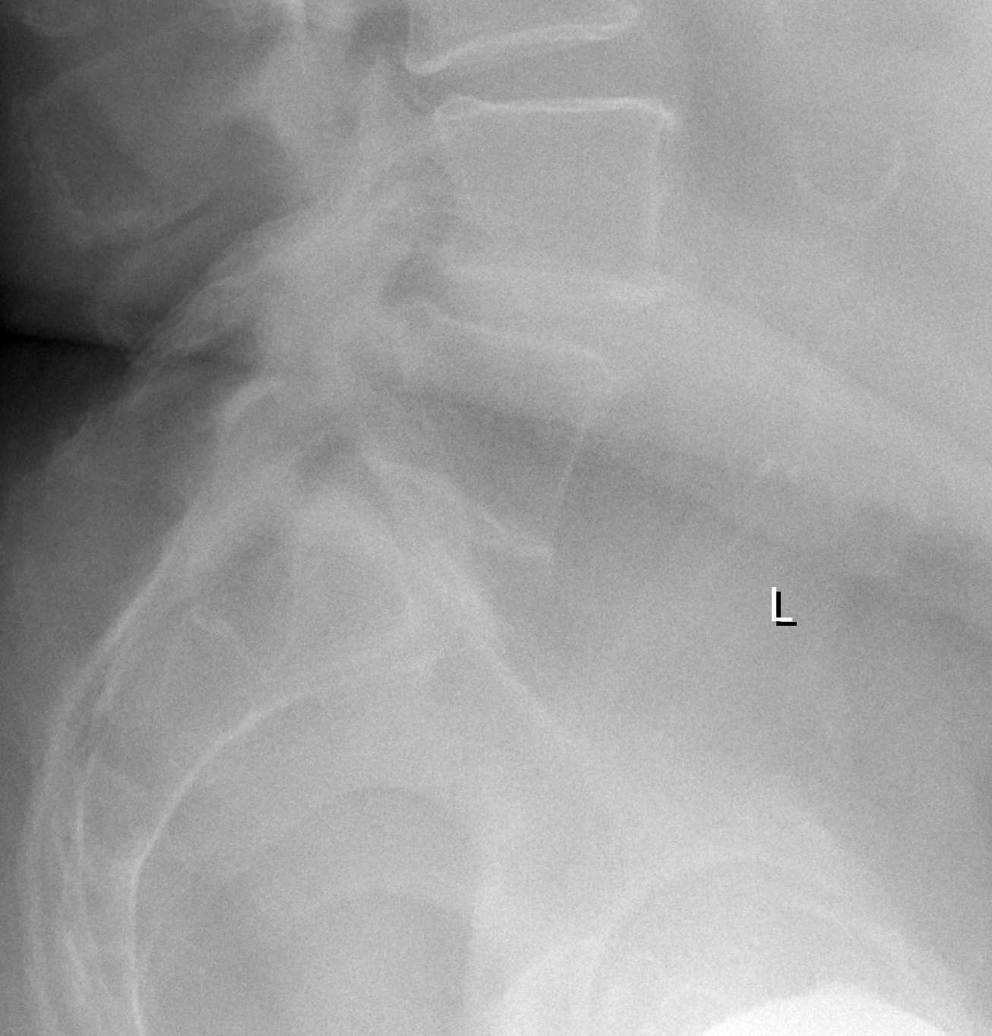

[5 of 5 positions shown; findings below may reference images not displayed]

FINDINGS: Partially visualized sternotomy changes. Minimal scoliosis. Lumbar
alignment otherwise within normal limits. Vertebral body heights
grossly maintained. Moderate multilevel degenerative changes with
osteophytes. Atherosclerosis.
IMPRESSION: Degenerative changes.  No acute osseous abnormality

## 2018-10-14 ENCOUNTER — Emergency Department (HOSPITAL_BASED_OUTPATIENT_CLINIC_OR_DEPARTMENT_OTHER)
Admission: EM | Admit: 2018-10-14 | Discharge: 2018-10-14 | Disposition: A | Discharge status: Home / Self Care | Payer: Medicare Other | Attending: Emergency Medicine | Admitting: Emergency Medicine

## 2018-10-14 ENCOUNTER — Other Ambulatory Visit: Payer: Self-pay

## 2018-10-14 ENCOUNTER — Encounter (HOSPITAL_BASED_OUTPATIENT_CLINIC_OR_DEPARTMENT_OTHER): Payer: Self-pay | Admitting: Emergency Medicine

## 2018-10-14 DIAGNOSIS — E119 Type 2 diabetes mellitus without complications: Secondary | ICD-10-CM | POA: Insufficient documentation

## 2018-10-14 DIAGNOSIS — M10371 Gout due to renal impairment, right ankle and foot: Secondary | ICD-10-CM

## 2018-10-14 DIAGNOSIS — Z96649 Presence of unspecified artificial hip joint: Secondary | ICD-10-CM | POA: Diagnosis not present

## 2018-10-14 DIAGNOSIS — I10 Essential (primary) hypertension: Secondary | ICD-10-CM | POA: Diagnosis not present

## 2018-10-14 DIAGNOSIS — M109 Gout, unspecified: Secondary | ICD-10-CM | POA: Insufficient documentation

## 2018-10-14 DIAGNOSIS — Z7982 Long term (current) use of aspirin: Secondary | ICD-10-CM | POA: Insufficient documentation

## 2018-10-14 DIAGNOSIS — Z79899 Other long term (current) drug therapy: Secondary | ICD-10-CM | POA: Diagnosis not present

## 2018-10-14 DIAGNOSIS — M25571 Pain in right ankle and joints of right foot: Secondary | ICD-10-CM | POA: Diagnosis present

## 2018-10-14 MED ORDER — COLCHICINE 0.6 MG PO TABS
0.6000 mg | ORAL_TABLET | Freq: Once | ORAL | Status: AC
  Administered 2018-10-14: 0.6 mg via ORAL
  Filled 2018-10-14: qty 1

## 2018-10-14 MED ORDER — PREDNISONE 20 MG PO TABS: 40.0000 mg | ORAL_TABLET | Freq: Every day | ORAL | 0 refills | Status: AC

## 2018-10-14 MED ORDER — PREDNISONE 20 MG PO TABS
40.0000 mg | ORAL_TABLET | Freq: Once | ORAL | Status: AC
  Administered 2018-10-14: 40 mg via ORAL
  Filled 2018-10-14: qty 2

## 2018-10-14 MED ORDER — HYDROCODONE-ACETAMINOPHEN 5-325 MG PO TABS: 1.0000 | ORAL_TABLET | ORAL | 0 refills | Status: AC | PRN

## 2018-10-14 MED ORDER — OXYCODONE-ACETAMINOPHEN 5-325 MG PO TABS
1.0000 | ORAL_TABLET | Freq: Once | ORAL | Status: AC
  Administered 2018-10-14: 1 via ORAL
  Filled 2018-10-14: qty 1

## 2018-10-14 NOTE — ED Triage Notes (Signed)
Patient states that he has a hx of gout - his gout medication was decreased about 10 days ago and now has pain and swelling to his right foot

## 2018-10-14 NOTE — ED Provider Notes (Signed)
[facility] EMERGENCY DEPARTMENT Provider Note   CSN: [id number] Arrival date & time: 10/14/18  [id number]     History   Chief Complaint Chief Complaint  Patient presents with  . Foot Pain    HPI Wayne Miles is a 68 y.o. male.  HPI   68 year old male with right foot pain.  No trauma.  History of gout.  Symptoms began approximately 3 days ago near his right big toe.  Progressing and also having pain in the right ankle.  Has not had a gout flare in several years.  His nephrologist recently recommended decreasing his allopurinol because of this.  He is tried taking Tylenol without significant improvement.  No fevers or chills.  Denies any other acute joint pain or systemic symptoms.  Past Medical History:  Diagnosis Date  . Diabetes mellitus without complication (HCC)   . Gout   . High cholesterol   . Hypertension     There are no active problems to display for this patient.   Past Surgical History:  Procedure Laterality Date  . CARDIAC SURGERY    . TOTAL HIP ARTHROPLASTY          Home Medications    Prior to Admission medications   Medication Sig Start Date End Date Taking? Authorizing Provider  ALLOPURINOL PO Take by mouth.    [provider]  aspirin 81 MG tablet Take 81 mg by mouth daily.    [provider]  furosemide (LASIX) 20 MG tablet Take 20 mg by mouth.    [provider]  glipiZIDE (GLUCOTROL XL) 5 MG 24 hr tablet Take 5 mg by mouth daily with breakfast.    [provider]  labetalol (NORMODYNE) 300 MG tablet Take 300 mg by mouth 2 (two) times daily.    [provider]  losartan (COZAAR) 50 MG tablet Take 50 mg by mouth daily.    [provider]  oxyCODONE-acetaminophen (PERCOCET) 5-325 MG tablet Take 1-2 tablets by mouth every 6 (six) hours as needed (for pain; may cause constipation). 10/04/16   Molpus, John, MD  pentoxifylline (TRENTAL) 400 MG CR tablet Take 400 mg by mouth 3 (three) times  daily with meals.    [provider]  Pioglitazone HCl (ACTOS PO) Take by mouth.    [provider]  SIMVASTATIN PO Take by mouth.    [provider]    Family History No family history on file.  Social History Social History   Tobacco Use  . Smoking status: Never Smoker  . Smokeless tobacco: Never Used  Substance Use Topics  . Alcohol use: No  . Drug use: No     Allergies   Penicillins   Review of Systems Review of Systems  All systems reviewed and negative, other than as noted in HPI.  Physical Exam Updated Vital Signs BP (!) 154/82 (BP Location: Left Arm)   Pulse 77   Temp 98.7 F (37.1 C) (Oral)   Resp 18   SpO2 100%   Physical Exam Vitals signs and nursing note reviewed.  Constitutional:      General: He is not in acute distress.    Appearance: He is well-developed.  HENT:     Head: Normocephalic and atraumatic.  Eyes:     General:        Right eye: No discharge.        Left eye: No discharge.     Conjunctiva/sclera: Conjunctivae normal.  Neck:     Musculoskeletal:  Neck supple.  Cardiovascular:     Rate and Rhythm: Normal rate and regular rhythm.     Heart sounds: Normal heart sounds. No murmur. No friction rub. No gallop.   Pulmonary:     Effort: Pulmonary effort is normal. No respiratory distress.     Breath sounds: Normal breath sounds.  Abdominal:     General: There is no distension.     Palpations: Abdomen is soft.     Tenderness: There is no abdominal tenderness.  Musculoskeletal:     Comments: Onychomycosis.  Erythema mild swelling around the right first MP joint.  Exquisite pain with just light touch and could not tolerate even passive range of motion.  Pain with range of motion the right ankle as well and perhaps some mild swelling.  No calf tenderness.  Skin:    General: Skin is warm and dry.  Neurological:     Mental Status: He is alert.  Psychiatric:        Behavior: Behavior normal.        Thought  Content: Thought content normal.      ED Treatments / Results  Labs (all labs ordered are listed, but only abnormal results are displayed) Labs Reviewed - No data to display  EKG None  Radiology No results found.  Procedures Procedures (including critical care time)  Medications Ordered in ED Medications - No data to display   Initial Impression / Assessment and Plan / ED Course  I have reviewed the triage vital signs and the nursing notes.  Pertinent labs & imaging results that were available during my care of the patient were reviewed by me and considered in my medical decision making (see chart for details).     68 year old male with atraumatic foot/ankle pain.  History of gout.  Symptoms and exam are consistent with a gout flare.  Doubt DVT, arterial occlusion or infectious process.  Probably related to underlying renal disease.  Advised to avoid alcohol and briefly discussed a low purine diet.  He was given a dose of colchicine here.  Deferred from further dosing given his chronic kidney disease.  Will defer from NSAIDs for the same reason.  Course of prednisone and as needed pain medication.  Advised that his blood sugar will be higher being on the steroids.  Return precautions were discussed.  Outpatient follow-up otherwise.  Final Clinical Impressions(s) / ED Diagnoses   Final diagnoses:  Acute gout due to renal impairment involving toe of right foot    ED Discharge Orders    None       Raeford Razor, MD 10/14/18 [phone]

## 2023-06-07 DIAGNOSIS — Z23 Encounter for immunization: Secondary | ICD-10-CM
